# Patient Record
Sex: Female | Born: 1935 | Race: Black or African American | Hispanic: No
Health system: Southern US, Community
[De-identification: ages and names within clinical notes are randomized; demographics above are authoritative.]

---

## 2005-03-16 ENCOUNTER — Ambulatory Visit: Payer: Self-pay | Admitting: Internal Medicine

## 2005-10-07 ENCOUNTER — Ambulatory Visit: Payer: Self-pay | Admitting: Gastroenterology

## 2006-03-22 ENCOUNTER — Ambulatory Visit: Payer: Self-pay | Admitting: Internal Medicine

## 2006-03-23 ENCOUNTER — Inpatient Hospital Stay: Payer: Self-pay | Admitting: Orthopedic Surgery

## 2006-03-27 ENCOUNTER — Encounter: Payer: Self-pay | Admitting: Internal Medicine

## 2006-04-12 ENCOUNTER — Ambulatory Visit: Payer: Self-pay | Admitting: Internal Medicine

## 2006-08-26 ENCOUNTER — Ambulatory Visit: Payer: Self-pay | Admitting: Orthopedic Surgery

## 2006-09-09 ENCOUNTER — Inpatient Hospital Stay: Payer: Self-pay | Admitting: Orthopedic Surgery

## 2006-09-13 ENCOUNTER — Encounter: Payer: Self-pay | Admitting: Internal Medicine

## 2006-10-13 ENCOUNTER — Encounter: Payer: Self-pay | Admitting: Internal Medicine

## 2007-08-01 ENCOUNTER — Ambulatory Visit: Payer: Self-pay | Admitting: Internal Medicine

## 2008-01-04 ENCOUNTER — Ambulatory Visit: Payer: Self-pay | Admitting: Unknown Physician Specialty

## 2010-03-03 ENCOUNTER — Ambulatory Visit: Payer: Self-pay | Admitting: Internal Medicine

## 2012-01-13 ENCOUNTER — Ambulatory Visit: Payer: Self-pay

## 2012-02-10 ENCOUNTER — Ambulatory Visit: Payer: Self-pay | Admitting: Internal Medicine

## 2012-06-09 ENCOUNTER — Inpatient Hospital Stay: Payer: Self-pay | Admitting: Internal Medicine

## 2012-06-09 LAB — COMPREHENSIVE METABOLIC PANEL
Albumin: 3.2 g/dL — ABNORMAL LOW (ref 3.4–5.0)
Alkaline Phosphatase: 119 U/L (ref 50–136)
Anion Gap: 10 (ref 7–16)
BUN: 17 mg/dL (ref 7–18)
Bilirubin,Total: 0.3 mg/dL (ref 0.2–1.0)
Calcium, Total: 8.8 mg/dL (ref 8.5–10.1)
Chloride: 107 mmol/L (ref 98–107)
Creatinine: 1.03 mg/dL (ref 0.60–1.30)
EGFR (Non-African Amer.): 48 — ABNORMAL LOW
Glucose: 100 mg/dL — ABNORMAL HIGH (ref 65–99)
Potassium: 4 mmol/L (ref 3.5–5.1)
SGPT (ALT): 39 U/L (ref 12–78)
Sodium: 140 mmol/L (ref 136–145)
Total Protein: 7.3 g/dL (ref 6.4–8.2)

## 2012-06-09 LAB — URINALYSIS, COMPLETE
Blood: NEGATIVE
Glucose,UR: NEGATIVE mg/dL (ref 0–75)
Nitrite: NEGATIVE
Protein: NEGATIVE
RBC,UR: 2 /HPF (ref 0–5)
Specific Gravity: 1.014 (ref 1.003–1.030)
WBC UR: 6 /HPF (ref 0–5)

## 2012-06-09 LAB — CK TOTAL AND CKMB (NOT AT ARMC)
CK, Total: 68 U/L (ref 21–215)
CK-MB: 2.4 ng/mL (ref 0.5–3.6)

## 2012-06-09 LAB — CBC WITH DIFFERENTIAL/PLATELET
Basophil #: 0 10*3/uL (ref 0.0–0.1)
Eosinophil %: 0.4 %
HCT: 35 % (ref 35.0–47.0)
Lymphocyte #: 0.7 10*3/uL — ABNORMAL LOW (ref 1.0–3.6)
Lymphocyte %: 7.8 %
MCV: 101 fL — ABNORMAL HIGH (ref 80–100)
Monocyte %: 7.1 %
Neutrophil #: 7.6 10*3/uL — ABNORMAL HIGH (ref 1.4–6.5)
Neutrophil %: 84.5 %
RBC: 3.46 10*6/uL — ABNORMAL LOW (ref 3.80–5.20)
RDW: 15.2 % — ABNORMAL HIGH (ref 11.5–14.5)
WBC: 8.9 10*3/uL (ref 3.6–11.0)

## 2012-06-09 LAB — TSH: Thyroid Stimulating Horm: 2.76 u[IU]/mL

## 2012-06-09 LAB — TROPONIN I: Troponin-I: <0.02 ng/mL

## 2012-06-10 LAB — CK TOTAL AND CKMB (NOT AT ARMC)
CK, Total: 54 U/L (ref 21–215)
CK-MB: 2 ng/mL (ref 0.5–3.6)

## 2012-06-10 LAB — TROPONIN I: Troponin-I: 0.03 ng/mL

## 2012-06-13 LAB — BASIC METABOLIC PANEL
Chloride: 106 mmol/L (ref 98–107)
Co2: 24 mmol/L (ref 21–32)
Creatinine: 1.36 mg/dL — ABNORMAL HIGH (ref 0.60–1.30)
EGFR (African American): 40 — ABNORMAL LOW
EGFR (Non-African Amer.): 34 — ABNORMAL LOW
Sodium: 139 mmol/L (ref 136–145)

## 2012-06-13 LAB — CBC WITH DIFFERENTIAL/PLATELET
Basophil #: 0 10*3/uL (ref 0.0–0.1)
Lymphocyte #: 1.6 10*3/uL (ref 1.0–3.6)
Lymphocyte %: 18.1 %
MCHC: 34.6 g/dL (ref 32.0–36.0)
MCV: 101 fL — ABNORMAL HIGH (ref 80–100)
Monocyte %: 9.3 %
Platelet: 184 10*3/uL (ref 150–440)
RDW: 14.3 % (ref 11.5–14.5)
WBC: 8.9 10*3/uL (ref 3.6–11.0)

## 2012-06-14 LAB — CBC WITH DIFFERENTIAL/PLATELET
Basophil #: 0 10*3/uL (ref 0.0–0.1)
Eosinophil #: 0.3 10*3/uL (ref 0.0–0.7)
HCT: 30.6 % — ABNORMAL LOW (ref 35.0–47.0)
HGB: 10.8 g/dL — ABNORMAL LOW (ref 12.0–16.0)
Lymphocyte #: 1.1 10*3/uL (ref 1.0–3.6)
Lymphocyte %: 20.6 %
MCHC: 35.3 g/dL (ref 32.0–36.0)
MCV: 101 fL — ABNORMAL HIGH (ref 80–100)
Monocyte %: 11.8 %
Neutrophil #: 3.3 10*3/uL (ref 1.4–6.5)
Neutrophil %: 61.9 %
Platelet: 173 10*3/uL (ref 150–440)
RBC: 3.03 10*6/uL — ABNORMAL LOW (ref 3.80–5.20)
RDW: 14.8 % — ABNORMAL HIGH (ref 11.5–14.5)

## 2012-06-14 LAB — FOLATE: Folic Acid: 15.4 ng/mL (ref 3.1–100.0)

## 2012-06-14 LAB — BASIC METABOLIC PANEL WITH GFR
Anion Gap: 8 (ref 7–16)
BUN: 24 mg/dL — ABNORMAL HIGH (ref 7–18)
Calcium, Total: 8.3 mg/dL — ABNORMAL LOW (ref 8.5–10.1)
Chloride: 108 mmol/L — ABNORMAL HIGH (ref 98–107)
Co2: 24 mmol/L (ref 21–32)
Creatinine: 1.12 mg/dL (ref 0.60–1.30)
EGFR (African American): 50 — ABNORMAL LOW
EGFR (Non-African Amer.): 44 — ABNORMAL LOW
Glucose: 98 mg/dL (ref 65–99)
Osmolality: 283 (ref 275–301)
Potassium: 4.2 mmol/L (ref 3.5–5.1)
Sodium: 140 mmol/L (ref 136–145)

## 2012-07-10 ENCOUNTER — Inpatient Hospital Stay: Payer: Self-pay | Admitting: Internal Medicine

## 2012-07-10 LAB — URINALYSIS, COMPLETE
Glucose,UR: 150 mg/dL (ref 0–75)
Hyaline Cast: 1
Protein: NEGATIVE
Specific Gravity: 1.008 (ref 1.003–1.030)
WBC UR: 12 /HPF (ref 0–5)

## 2012-07-10 LAB — CBC
HCT: 34.9 % — ABNORMAL LOW (ref 35.0–47.0)
HGB: 11.5 g/dL — ABNORMAL LOW (ref 12.0–16.0)
MCH: 33.4 pg (ref 26.0–34.0)
MCHC: 33 g/dL (ref 32.0–36.0)
RBC: 3.44 10*6/uL — ABNORMAL LOW (ref 3.80–5.20)
RDW: 15.4 % — ABNORMAL HIGH (ref 11.5–14.5)

## 2012-07-10 LAB — COMPREHENSIVE METABOLIC PANEL
Alkaline Phosphatase: 170 U/L — ABNORMAL HIGH (ref 50–136)
BUN: 16 mg/dL (ref 7–18)
Bilirubin,Total: 0.4 mg/dL (ref 0.2–1.0)
Chloride: 104 mmol/L (ref 98–107)
Co2: 27 mmol/L (ref 21–32)
EGFR (Non-African Amer.): 53 — ABNORMAL LOW
Glucose: 170 mg/dL — ABNORMAL HIGH (ref 65–99)
Potassium: 3.3 mmol/L — ABNORMAL LOW (ref 3.5–5.1)
SGOT(AST): 32 U/L (ref 15–37)
Total Protein: 7.7 g/dL (ref 6.4–8.2)

## 2012-07-10 LAB — TROPONIN I: Troponin-I: <0.02 ng/mL

## 2012-07-11 LAB — BASIC METABOLIC PANEL
Anion Gap: 9 (ref 7–16)
BUN: 11 mg/dL (ref 7–18)
Chloride: 111 mmol/L — ABNORMAL HIGH (ref 98–107)
Co2: 24 mmol/L (ref 21–32)
Creatinine: 0.93 mg/dL (ref 0.60–1.30)
EGFR (African American): >60
Glucose: 90 mg/dL (ref 65–99)
Osmolality: 286 (ref 275–301)
Potassium: 3.9 mmol/L (ref 3.5–5.1)
Sodium: 144 mmol/L (ref 136–145)

## 2012-07-11 LAB — TROPONIN I: Troponin-I: 0.03 ng/mL

## 2012-07-11 LAB — HEMOGLOBIN A1C: Hemoglobin A1C: 5.4 % (ref 4.2–6.3)

## 2012-07-11 LAB — CBC WITH DIFFERENTIAL/PLATELET
Basophil %: 0.4 %
Lymphocyte #: 1.2 10*3/uL (ref 1.0–3.6)
Lymphocyte %: 17.1 %
MCHC: 33.8 g/dL (ref 32.0–36.0)
MCV: 101 fL — ABNORMAL HIGH (ref 80–100)
Monocyte #: 0.7 x10 3/mm (ref 0.2–0.9)
RBC: 3 10*6/uL — ABNORMAL LOW (ref 3.80–5.20)

## 2012-07-11 LAB — MAGNESIUM: Magnesium: 1.8 mg/dL

## 2012-07-12 LAB — BASIC METABOLIC PANEL
Anion Gap: 7 (ref 7–16)
Chloride: 111 mmol/L — ABNORMAL HIGH (ref 98–107)
EGFR (Non-African Amer.): 55 — ABNORMAL LOW
Glucose: 95 mg/dL (ref 65–99)
Osmolality: 286 (ref 275–301)
Sodium: 144 mmol/L (ref 136–145)

## 2012-07-12 LAB — CBC WITH DIFFERENTIAL/PLATELET
Basophil #: 0.1 10*3/uL (ref 0.0–0.1)
Basophil %: 1.3 %
Eosinophil #: 0.3 10*3/uL (ref 0.0–0.7)
Eosinophil %: 3.8 %
HCT: 30.3 % — ABNORMAL LOW (ref 35.0–47.0)
MCV: 101 fL — ABNORMAL HIGH (ref 80–100)
Monocyte #: 0.7 x10 3/mm (ref 0.2–0.9)
Neutrophil #: 4.5 10*3/uL (ref 1.4–6.5)
Neutrophil %: 66.9 %
RBC: 3 10*6/uL — ABNORMAL LOW (ref 3.80–5.20)
WBC: 6.7 10*3/uL (ref 3.6–11.0)

## 2012-07-14 ENCOUNTER — Encounter: Payer: Self-pay | Admitting: Internal Medicine

## 2012-07-15 ENCOUNTER — Encounter: Payer: Self-pay | Admitting: Internal Medicine

## 2012-07-18 LAB — TSH: Thyroid Stimulating Horm: 3.64 u[IU]/mL

## 2012-07-28 LAB — BASIC METABOLIC PANEL
Anion Gap: 7 (ref 7–16)
BUN: 22 mg/dL — ABNORMAL HIGH (ref 7–18)
Chloride: 109 mmol/L — ABNORMAL HIGH (ref 98–107)
Creatinine: 0.91 mg/dL (ref 0.60–1.30)
EGFR (African American): >60
EGFR (Non-African Amer.): 56 — ABNORMAL LOW
Potassium: 3.6 mmol/L (ref 3.5–5.1)
Sodium: 143 mmol/L (ref 136–145)

## 2012-07-28 LAB — CBC WITH DIFFERENTIAL/PLATELET
Basophil #: 0 10*3/uL (ref 0.0–0.1)
Eosinophil %: 1.3 %
HCT: 35.4 % (ref 35.0–47.0)
Lymphocyte #: 1.4 10*3/uL (ref 1.0–3.6)
Lymphocyte %: 18.4 %
MCH: 33.2 pg (ref 26.0–34.0)
MCHC: 32.7 g/dL (ref 32.0–36.0)
MCV: 101 fL — ABNORMAL HIGH (ref 80–100)
Monocyte #: 0.5 x10 3/mm (ref 0.2–0.9)
Monocyte %: 6.2 %
Neutrophil #: 5.4 10*3/uL (ref 1.4–6.5)
Platelet: 215 10*3/uL (ref 150–440)
RBC: 3.49 10*6/uL — ABNORMAL LOW (ref 3.80–5.20)
RDW: 15.6 % — ABNORMAL HIGH (ref 11.5–14.5)

## 2012-07-28 LAB — LIPID PANEL
Cholesterol: 179 mg/dL (ref 0–200)
Ldl Cholesterol, Calc: 88 mg/dL (ref 0–100)
Triglycerides: 127 mg/dL (ref 0–200)

## 2012-07-28 LAB — HEPATIC FUNCTION PANEL A (ARMC)
Bilirubin, Direct: <0.1 mg/dL (ref 0.00–0.20)
SGOT(AST): 28 U/L (ref 15–37)
SGPT (ALT): 20 U/L (ref 12–78)

## 2012-07-28 LAB — TSH: Thyroid Stimulating Horm: 3.81 u[IU]/mL

## 2012-12-25 ENCOUNTER — Encounter: Payer: Self-pay | Admitting: Internal Medicine

## 2013-01-12 ENCOUNTER — Encounter: Payer: Self-pay | Admitting: Internal Medicine

## 2013-01-30 LAB — CBC WITH DIFFERENTIAL/PLATELET
HCT: 34.7 % — ABNORMAL LOW (ref 35.0–47.0)
HGB: 11.9 g/dL — ABNORMAL LOW (ref 12.0–16.0)
Lymphocyte %: 17.5 %
MCHC: 34.3 g/dL (ref 32.0–36.0)
MCV: 100 fL (ref 80–100)
Monocyte #: 0.5 x10 3/mm (ref 0.2–0.9)
Monocyte %: 5.3 %
Neutrophil %: 75.4 %
Platelet: 166 10*3/uL (ref 150–440)
RBC: 3.46 10*6/uL — ABNORMAL LOW (ref 3.80–5.20)
WBC: 9.1 10*3/uL (ref 3.6–11.0)

## 2013-01-30 LAB — BASIC METABOLIC PANEL
Calcium, Total: 8.8 mg/dL (ref 8.5–10.1)
Chloride: 106 mmol/L (ref 98–107)
Co2: 31 mmol/L (ref 21–32)
Glucose: 80 mg/dL (ref 65–99)
Potassium: 3.7 mmol/L (ref 3.5–5.1)

## 2013-01-30 LAB — CREATININE, SERUM
Creatinine: 0.93 mg/dL (ref 0.60–1.30)
EGFR (African American): >60

## 2013-01-30 LAB — LIPID PANEL
Cholesterol: 187 mg/dL (ref 0–200)
HDL Cholesterol: 81 mg/dL — ABNORMAL HIGH (ref 40–60)
Ldl Cholesterol, Calc: 87 mg/dL (ref 0–100)
Triglycerides: 95 mg/dL (ref 0–200)
VLDL Cholesterol, Calc: 19 mg/dL (ref 5–40)

## 2013-01-30 LAB — ALT: SGPT (ALT): 33 U/L (ref 12–78)

## 2013-01-30 LAB — ALBUMIN: Albumin: 3.2 g/dL — ABNORMAL LOW (ref 3.4–5.0)

## 2013-01-30 LAB — SGOT (AST)(ARMC): SGOT(AST): 28 U/L (ref 15–37)

## 2013-02-12 ENCOUNTER — Encounter: Payer: Self-pay | Admitting: Internal Medicine

## 2013-03-14 ENCOUNTER — Encounter: Payer: Self-pay | Admitting: Internal Medicine

## 2013-04-14 ENCOUNTER — Encounter: Payer: Self-pay | Admitting: Internal Medicine

## 2013-04-17 LAB — CBC WITH DIFFERENTIAL/PLATELET
Basophil #: 0 10*3/uL (ref 0.0–0.1)
Eosinophil #: 0.2 10*3/uL (ref 0.0–0.7)
Eosinophil %: 2.3 %
HCT: 30.3 % — ABNORMAL LOW (ref 35.0–47.0)
HGB: 10.6 g/dL — ABNORMAL LOW (ref 12.0–16.0)
MCHC: 34.8 g/dL (ref 32.0–36.0)
Monocyte %: 7.8 %
Neutrophil #: 4.3 10*3/uL (ref 1.4–6.5)
Neutrophil %: 59.4 %
RBC: 3 10*6/uL — ABNORMAL LOW (ref 3.80–5.20)
WBC: 7.3 10*3/uL (ref 3.6–11.0)

## 2013-04-17 LAB — CREATININE, SERUM
EGFR (African American): >60
EGFR (Non-African Amer.): 56 — ABNORMAL LOW

## 2013-04-17 LAB — ALBUMIN: Albumin: 2.8 g/dL — ABNORMAL LOW (ref 3.4–5.0)

## 2013-04-17 LAB — ALT: SGPT (ALT): 41 U/L (ref 12–78)

## 2013-04-17 LAB — SEDIMENTATION RATE: Erythrocyte Sed Rate: 58 mm/hr — ABNORMAL HIGH (ref 0–30)

## 2013-05-14 ENCOUNTER — Encounter: Payer: Self-pay | Admitting: Internal Medicine

## 2013-06-14 ENCOUNTER — Encounter: Payer: Self-pay | Admitting: Internal Medicine

## 2013-06-21 LAB — ALT: ALT: 27 U/L (ref 12–78)

## 2013-06-21 LAB — CBC WITH DIFFERENTIAL/PLATELET
BASOS PCT: 0.2 %
Basophil #: 0 10*3/uL (ref 0.0–0.1)
Eosinophil #: 0.1 10*3/uL (ref 0.0–0.7)
Eosinophil %: 2.1 %
HCT: 34.5 % — AB (ref 35.0–47.0)
HGB: 11.6 g/dL — ABNORMAL LOW (ref 12.0–16.0)
Lymphocyte #: 2.4 10*3/uL (ref 1.0–3.6)
Lymphocyte %: 33.8 %
MCH: 34.8 pg — ABNORMAL HIGH (ref 26.0–34.0)
MCHC: 33.8 g/dL (ref 32.0–36.0)
MCV: 103 fL — ABNORMAL HIGH (ref 80–100)
Monocyte #: 0.6 x10 3/mm (ref 0.2–0.9)
Monocyte %: 8.9 %
NEUTROS ABS: 4 10*3/uL (ref 1.4–6.5)
Neutrophil %: 55 %
Platelet: 147 10*3/uL — ABNORMAL LOW (ref 150–440)
RBC: 3.34 10*6/uL — AB (ref 3.80–5.20)
RDW: 14.6 % — AB (ref 11.5–14.5)
WBC: 7.2 10*3/uL (ref 3.6–11.0)

## 2013-06-21 LAB — CREATININE, SERUM
Creatinine: 0.96 mg/dL (ref 0.60–1.30)
EGFR (African American): >60
GFR CALC NON AF AMER: 52 — AB

## 2013-06-21 LAB — ALBUMIN: ALBUMIN: 3.1 g/dL — AB (ref 3.4–5.0)

## 2013-06-21 LAB — SEDIMENTATION RATE: Erythrocyte Sed Rate: 41 mm/hr — ABNORMAL HIGH (ref 0–30)

## 2013-06-21 LAB — SGOT (AST)(ARMC): AST: 24 U/L (ref 15–37)

## 2013-06-26 LAB — TSH: Thyroid Stimulating Horm: 1.37 u[IU]/mL

## 2013-06-26 LAB — MAGNESIUM: Magnesium: 1.9 mg/dL

## 2013-07-15 ENCOUNTER — Encounter: Payer: Self-pay | Admitting: Internal Medicine

## 2013-08-12 ENCOUNTER — Encounter: Payer: Self-pay | Admitting: Internal Medicine

## 2013-08-16 LAB — SGOT (AST)(ARMC): SGOT(AST): 26 U/L (ref 15–37)

## 2013-08-16 LAB — CBC WITH DIFFERENTIAL/PLATELET
BASOS PCT: 0.2 %
Basophil #: 0 10*3/uL (ref 0.0–0.1)
EOS ABS: 0.2 10*3/uL (ref 0.0–0.7)
EOS PCT: 2.2 %
HCT: 34 % — AB (ref 35.0–47.0)
HGB: 11.5 g/dL — ABNORMAL LOW (ref 12.0–16.0)
LYMPHS ABS: 2.4 10*3/uL (ref 1.0–3.6)
Lymphocyte %: 33.4 %
MCH: 35.7 pg — AB (ref 26.0–34.0)
MCHC: 33.8 g/dL (ref 32.0–36.0)
MCV: 106 fL — ABNORMAL HIGH (ref 80–100)
MONOS PCT: 8.9 %
Monocyte #: 0.6 x10 3/mm (ref 0.2–0.9)
NEUTROS ABS: 4 10*3/uL (ref 1.4–6.5)
Neutrophil %: 55.3 %
PLATELETS: 126 10*3/uL — AB (ref 150–440)
RBC: 3.22 10*6/uL — AB (ref 3.80–5.20)
RDW: 15 % — AB (ref 11.5–14.5)
WBC: 7.2 10*3/uL (ref 3.6–11.0)

## 2013-08-16 LAB — CREATININE, SERUM
CREATININE: 0.98 mg/dL (ref 0.60–1.30)
EGFR (African American): 59 — ABNORMAL LOW
GFR CALC NON AF AMER: 51 — AB

## 2013-08-16 LAB — ALBUMIN: Albumin: 3.1 g/dL — ABNORMAL LOW (ref 3.4–5.0)

## 2013-08-16 LAB — ALT: ALT: 26 U/L (ref 12–78)

## 2013-08-16 LAB — SEDIMENTATION RATE: ERYTHROCYTE SED RATE: 34 mm/h — AB (ref 0–30)

## 2013-09-12 ENCOUNTER — Encounter: Payer: Self-pay | Admitting: Internal Medicine

## 2013-10-12 ENCOUNTER — Encounter: Payer: Self-pay | Admitting: Internal Medicine

## 2013-10-16 LAB — CBC WITH DIFFERENTIAL/PLATELET
Basophil #: 0 10*3/uL (ref 0.0–0.1)
Basophil %: 0.3 %
Eosinophil #: 0.2 10*3/uL (ref 0.0–0.7)
Eosinophil %: 2.4 %
HCT: 31.7 % — ABNORMAL LOW (ref 35.0–47.0)
HGB: 10.8 g/dL — AB (ref 12.0–16.0)
Lymphocyte #: 2.1 10*3/uL (ref 1.0–3.6)
Lymphocyte %: 29.8 %
MCH: 35.6 pg — AB (ref 26.0–34.0)
MCHC: 33.9 g/dL (ref 32.0–36.0)
MCV: 105 fL — ABNORMAL HIGH (ref 80–100)
Monocyte #: 0.5 x10 3/mm (ref 0.2–0.9)
Monocyte %: 7.6 %
Neutrophil #: 4.2 10*3/uL (ref 1.4–6.5)
Neutrophil %: 59.9 %
Platelet: 123 10*3/uL — ABNORMAL LOW (ref 150–440)
RBC: 3.02 10*6/uL — ABNORMAL LOW (ref 3.80–5.20)
RDW: 14.7 % — ABNORMAL HIGH (ref 11.5–14.5)
WBC: 7.1 10*3/uL (ref 3.6–11.0)

## 2013-10-16 LAB — CREATININE, SERUM
CREATININE: 0.8 mg/dL (ref 0.60–1.30)
EGFR (African American): >60
EGFR (Non-African Amer.): >60

## 2013-10-16 LAB — ALT: SGPT (ALT): 19 U/L (ref 12–78)

## 2013-10-16 LAB — ALBUMIN: ALBUMIN: 2.8 g/dL — AB (ref 3.4–5.0)

## 2013-10-16 LAB — SGOT (AST)(ARMC): SGOT(AST): 19 U/L (ref 15–37)

## 2013-10-16 LAB — SEDIMENTATION RATE: Erythrocyte Sed Rate: 34 mm/hr — ABNORMAL HIGH (ref 0–30)

## 2013-11-12 ENCOUNTER — Encounter: Payer: Self-pay | Admitting: Internal Medicine

## 2013-12-12 ENCOUNTER — Encounter: Payer: Self-pay | Admitting: Internal Medicine

## 2013-12-18 LAB — CBC WITH DIFFERENTIAL/PLATELET
BASOS ABS: 0 10*3/uL (ref 0.0–0.1)
Basophil %: 0.2 %
EOS PCT: 2.4 %
Eosinophil #: 0.2 10*3/uL (ref 0.0–0.7)
HCT: 36.7 % (ref 35.0–47.0)
HGB: 12 g/dL (ref 12.0–16.0)
LYMPHS PCT: 29.1 %
Lymphocyte #: 2.2 10*3/uL (ref 1.0–3.6)
MCH: 34.5 pg — AB (ref 26.0–34.0)
MCHC: 32.6 g/dL (ref 32.0–36.0)
MCV: 106 fL — AB (ref 80–100)
MONO ABS: 0.5 x10 3/mm (ref 0.2–0.9)
MONOS PCT: 6.6 %
Neutrophil #: 4.7 10*3/uL (ref 1.4–6.5)
Neutrophil %: 61.7 %
Platelet: 145 10*3/uL — ABNORMAL LOW (ref 150–440)
RBC: 3.47 10*6/uL — ABNORMAL LOW (ref 3.80–5.20)
RDW: 14.8 % — ABNORMAL HIGH (ref 11.5–14.5)
WBC: 7.6 10*3/uL (ref 3.6–11.0)

## 2013-12-18 LAB — SGOT (AST)(ARMC): AST: 24 U/L (ref 15–37)

## 2013-12-18 LAB — CREATININE, SERUM
Creatinine: 0.95 mg/dL (ref 0.60–1.30)
EGFR (African American): >60
EGFR (Non-African Amer.): 53 — ABNORMAL LOW

## 2013-12-18 LAB — SEDIMENTATION RATE: ERYTHROCYTE SED RATE: 1 mm/h (ref 0–30)

## 2013-12-18 LAB — ALBUMIN: Albumin: 3 g/dL — ABNORMAL LOW (ref 3.4–5.0)

## 2013-12-18 LAB — ALT: SGPT (ALT): 27 U/L (ref 12–78)

## 2014-01-12 ENCOUNTER — Encounter: Payer: Self-pay | Admitting: Internal Medicine

## 2014-02-12 ENCOUNTER — Encounter: Payer: Self-pay | Admitting: Internal Medicine

## 2014-02-12 LAB — CBC WITH DIFFERENTIAL/PLATELET
Basophil #: 0 10*3/uL (ref 0.0–0.1)
Basophil %: 0.2 %
EOS PCT: 2.2 %
Eosinophil #: 0.2 10*3/uL (ref 0.0–0.7)
HCT: 34.8 % — ABNORMAL LOW (ref 35.0–47.0)
HGB: 11.3 g/dL — AB (ref 12.0–16.0)
LYMPHS ABS: 1.7 10*3/uL (ref 1.0–3.6)
LYMPHS PCT: 24 %
MCH: 34.6 pg — ABNORMAL HIGH (ref 26.0–34.0)
MCHC: 32.5 g/dL (ref 32.0–36.0)
MCV: 106 fL — ABNORMAL HIGH (ref 80–100)
Monocyte #: 0.5 x10 3/mm (ref 0.2–0.9)
Monocyte %: 6.5 %
Neutrophil #: 4.7 10*3/uL (ref 1.4–6.5)
Neutrophil %: 67.1 %
Platelet: 135 10*3/uL — ABNORMAL LOW (ref 150–440)
RBC: 3.28 10*6/uL — ABNORMAL LOW (ref 3.80–5.20)
RDW: 14.8 % — AB (ref 11.5–14.5)
WBC: 7.1 10*3/uL (ref 3.6–11.0)

## 2014-02-12 LAB — CREATININE, SERUM
Creatinine: 1 mg/dL (ref 0.60–1.30)
GFR CALC AF AMER: 57 — AB
GFR CALC NON AF AMER: 50 — AB

## 2014-02-12 LAB — ALBUMIN: Albumin: 3.1 g/dL — ABNORMAL LOW (ref 3.4–5.0)

## 2014-02-12 LAB — SGOT (AST)(ARMC): SGOT(AST): 28 U/L (ref 15–37)

## 2014-02-12 LAB — SEDIMENTATION RATE: Erythrocyte Sed Rate: 19 mm/hr (ref 0–30)

## 2014-02-12 LAB — ALT: SGPT (ALT): 25 U/L

## 2014-03-14 ENCOUNTER — Encounter: Payer: Self-pay | Admitting: Internal Medicine

## 2014-04-14 ENCOUNTER — Encounter: Payer: Self-pay | Admitting: Internal Medicine

## 2014-04-16 LAB — SEDIMENTATION RATE: Erythrocyte Sed Rate: 47 mm/hr — ABNORMAL HIGH (ref 0–30)

## 2014-04-16 LAB — CBC WITH DIFFERENTIAL/PLATELET
BASOS PCT: 0.3 %
Basophil #: 0 10*3/uL (ref 0.0–0.1)
EOS PCT: 2.2 %
Eosinophil #: 0.2 10*3/uL (ref 0.0–0.7)
HCT: 34.6 % — ABNORMAL LOW (ref 35.0–47.0)
HGB: 11.4 g/dL — ABNORMAL LOW (ref 12.0–16.0)
Lymphocyte #: 1.9 10*3/uL (ref 1.0–3.6)
Lymphocyte %: 25 %
MCH: 34.5 pg — ABNORMAL HIGH (ref 26.0–34.0)
MCHC: 32.9 g/dL (ref 32.0–36.0)
MCV: 105 fL — ABNORMAL HIGH (ref 80–100)
MONOS PCT: 6.6 %
Monocyte #: 0.5 x10 3/mm (ref 0.2–0.9)
NEUTROS ABS: 5 10*3/uL (ref 1.4–6.5)
NEUTROS PCT: 65.9 %
PLATELETS: 143 10*3/uL — AB (ref 150–440)
RBC: 3.3 10*6/uL — ABNORMAL LOW (ref 3.80–5.20)
RDW: 14 % (ref 11.5–14.5)
WBC: 7.7 10*3/uL (ref 3.6–11.0)

## 2014-04-16 LAB — CREATININE, SERUM
Creatinine: 0.88 mg/dL (ref 0.60–1.30)
EGFR (African American): >60

## 2014-04-16 LAB — ALBUMIN: Albumin: 3 g/dL — ABNORMAL LOW (ref 3.4–5.0)

## 2014-04-16 LAB — ALT: SGPT (ALT): 28 U/L

## 2014-04-16 LAB — SGOT (AST)(ARMC): AST: 26 U/L (ref 15–37)

## 2014-05-14 ENCOUNTER — Encounter: Payer: Self-pay | Admitting: Internal Medicine

## 2014-06-14 ENCOUNTER — Encounter: Payer: Self-pay | Admitting: Internal Medicine

## 2014-06-17 LAB — RAPID INFLUENZA A&B ANTIGENS

## 2014-06-18 LAB — CBC WITH DIFFERENTIAL/PLATELET
Basophil #: 0 10*3/uL (ref 0.0–0.1)
Basophil %: 0.2 %
Eosinophil #: 0.1 10*3/uL (ref 0.0–0.7)
Eosinophil %: 0.9 %
HCT: 37.9 % (ref 35.0–47.0)
HGB: 12.6 g/dL (ref 12.0–16.0)
LYMPHS ABS: 1.4 10*3/uL (ref 1.0–3.6)
Lymphocyte %: 13.5 %
MCH: 34.6 pg — AB (ref 26.0–34.0)
MCHC: 33.4 g/dL (ref 32.0–36.0)
MCV: 104 fL — ABNORMAL HIGH (ref 80–100)
MONO ABS: 0.9 x10 3/mm (ref 0.2–0.9)
MONOS PCT: 9.1 %
Neutrophil #: 7.9 10*3/uL — ABNORMAL HIGH (ref 1.4–6.5)
Neutrophil %: 76.3 %
PLATELETS: 143 10*3/uL — AB (ref 150–440)
RBC: 3.65 10*6/uL — ABNORMAL LOW (ref 3.80–5.20)
RDW: 14.3 % (ref 11.5–14.5)
WBC: 10.4 10*3/uL (ref 3.6–11.0)

## 2014-06-18 LAB — CREATININE, SERUM
Creatinine: 1.1 mg/dL (ref 0.60–1.30)
EGFR (Non-African Amer.): 49 — ABNORMAL LOW
GFR CALC AF AMER: 60 — AB

## 2014-06-18 LAB — SEDIMENTATION RATE: ERYTHROCYTE SED RATE: 60 mm/h — AB (ref 0–30)

## 2014-06-18 LAB — SGOT (AST)(ARMC): SGOT(AST): 28 U/L (ref 15–37)

## 2014-06-18 LAB — ALT: SGPT (ALT): 31 U/L

## 2014-06-18 LAB — ALBUMIN: Albumin: 3.2 g/dL — ABNORMAL LOW (ref 3.4–5.0)

## 2014-06-23 LAB — URINALYSIS, COMPLETE
Bilirubin,UR: NEGATIVE
Blood: NEGATIVE
Glucose,UR: NEGATIVE mg/dL (ref 0–75)
Ketone: NEGATIVE
LEUKOCYTE ESTERASE: NEGATIVE
NITRITE: NEGATIVE
PH: 7 (ref 4.5–8.0)
PROTEIN: NEGATIVE
Specific Gravity: 1.017 (ref 1.003–1.030)
Squamous Epithelial: 1
WBC UR: NONE SEEN /HPF (ref 0–5)

## 2014-06-24 LAB — URINE CULTURE

## 2014-07-13 IMAGING — CT CT ANGIO ABD-PELV WO/W CM
2 of 4 series · 13 of 42 positions shown, 18 images · IV contrast (APPLIED)
Comparison: none

REASON FOR EXAM: abdomen pain
COMMENTS:

PROCEDURE:     CT  - CT ANGIOGRAPHY ABD/PEL W/WO  - June 14, 2012 [DATE]
RESULT:      Comparison:  None
TECHNIQUE: Multiple axial images of the abdomen and pelvis were performed
from the lung bases to the pubic symphysis, without p.o. contrast and with
100 mL of Isovue 370 intravenous contrast, according to the mesenteric
ischemia protocol. Coronal and sagittal MIP images were reconstructed.

[Series 6: venous · axial · portal-venous · 0.63mm/px · z∈[-705,-360]mm · 10 of 137 slices shown, 15 images]
[im 11/137  soft-tissue]
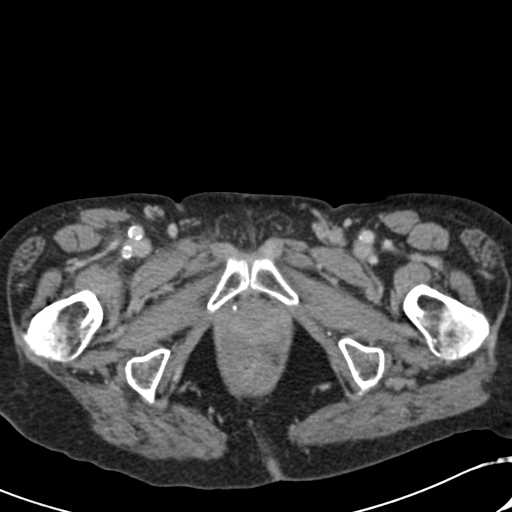
[im 11/137  bone]
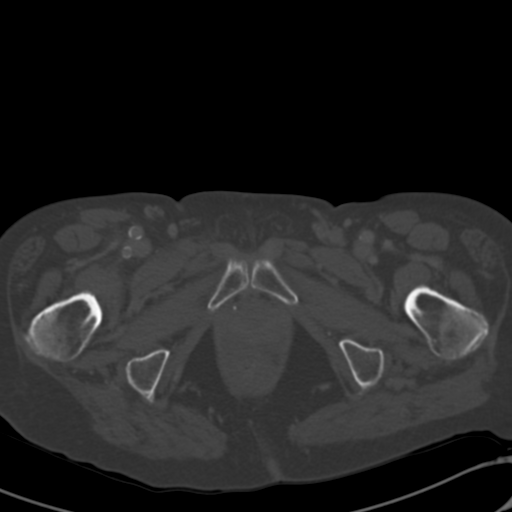
[im 32/137  soft-tissue]
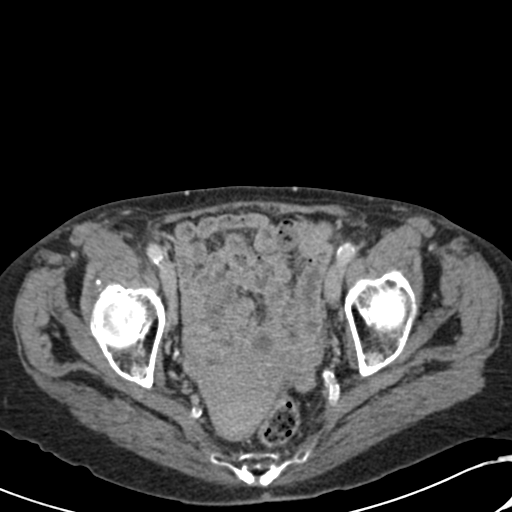
[im 42/137  soft-tissue]
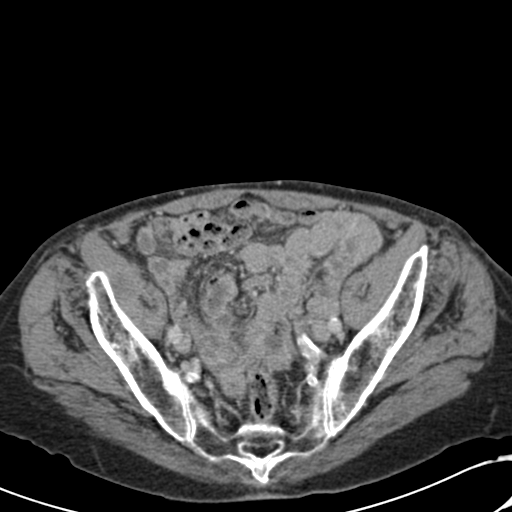
[im 53/137  soft-tissue]
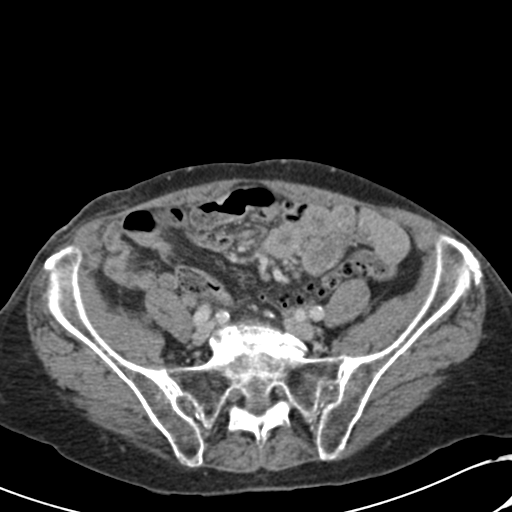
[im 74/137  soft-tissue]
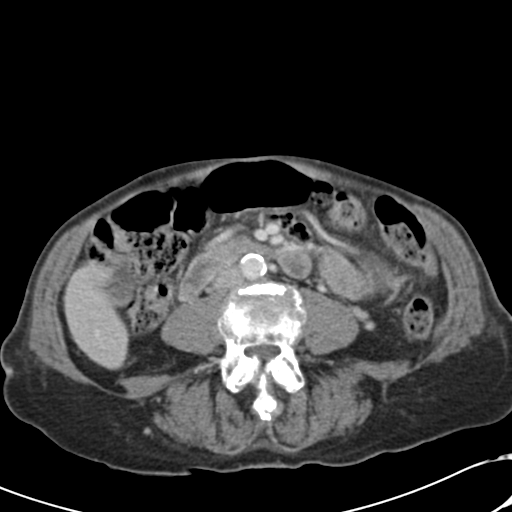
[im 84/137  soft-tissue]
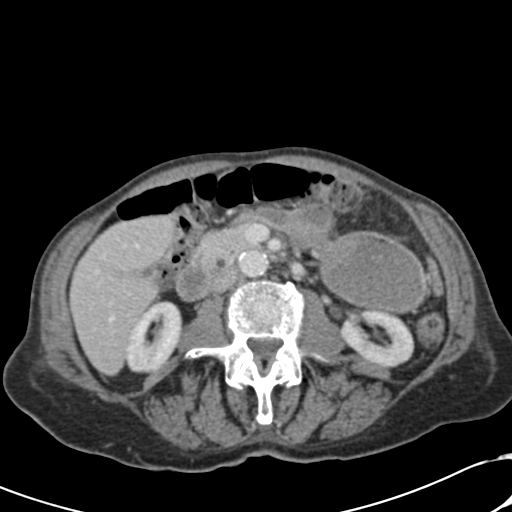
[im 95/137  soft-tissue]
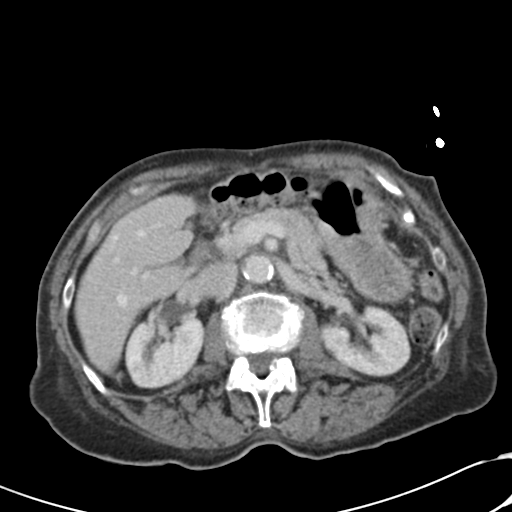
[im 95/137  lung]
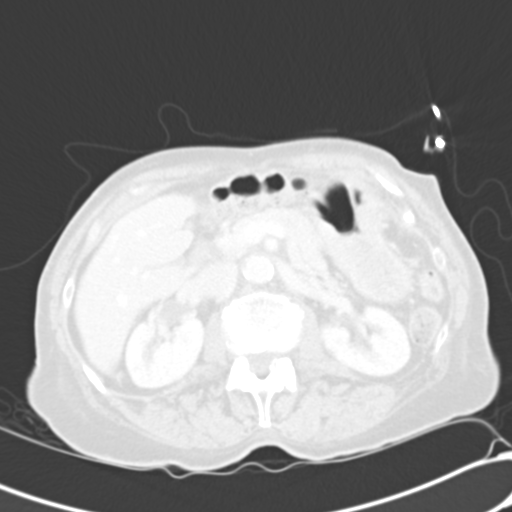
[im 105/137  lung]
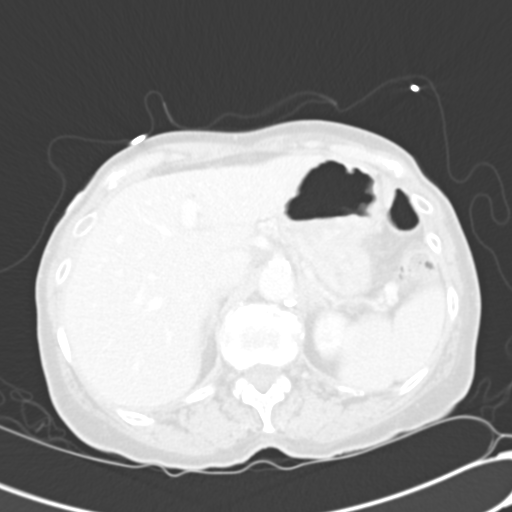
[im 116/137  soft-tissue]
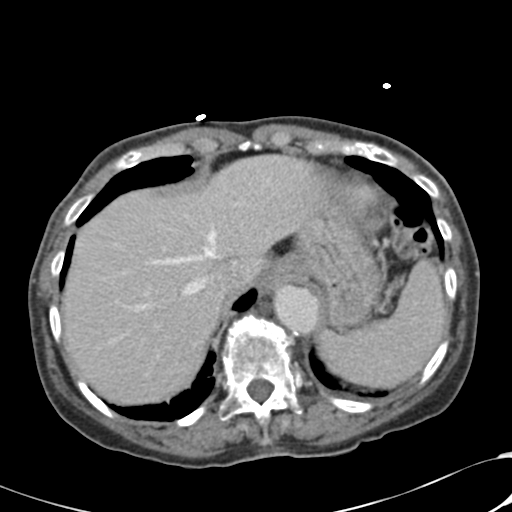
[im 116/137  lung]
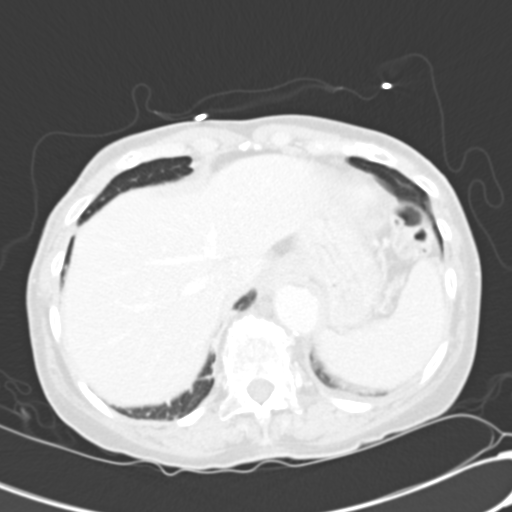
[im 126/137  soft-tissue]
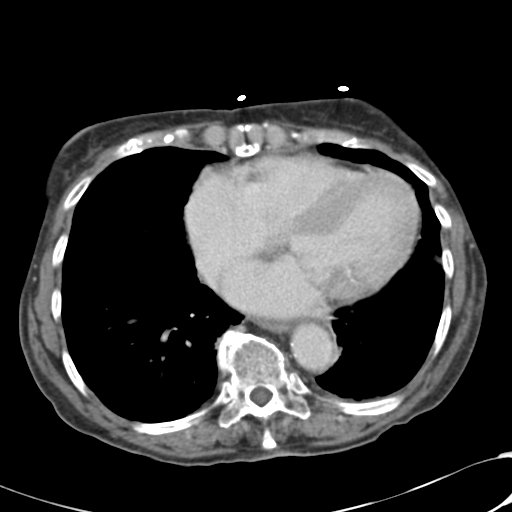
[im 126/137  lung]
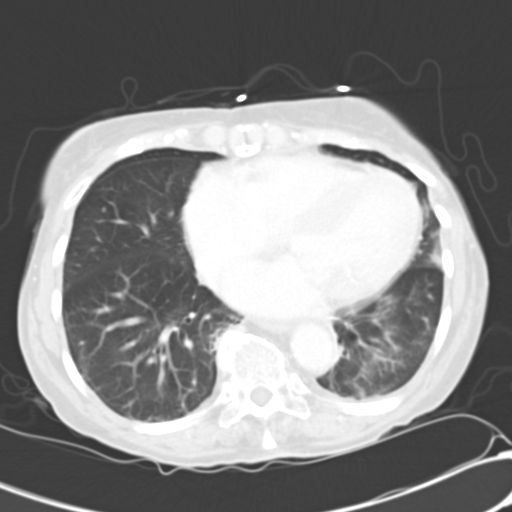
[im 126/137  bone]
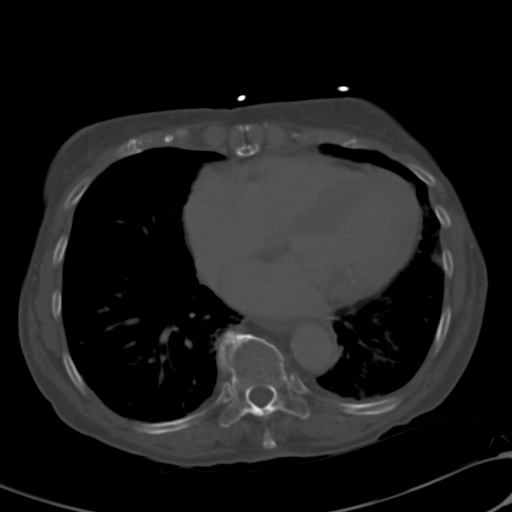

[Series 602: coronal · coronal · 0.80mm/px · 3 of 183 slices shown]
[im 61/183  soft-tissue]
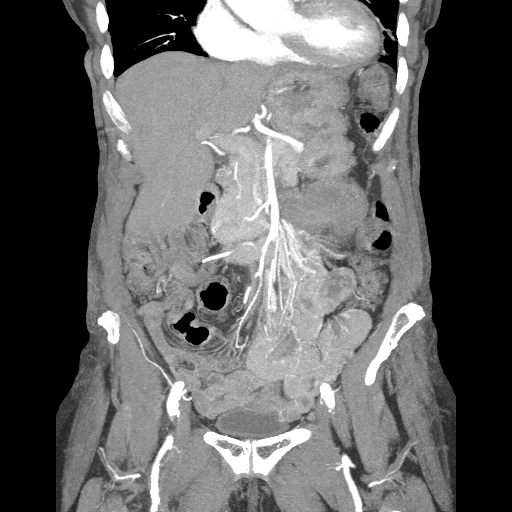
[im 81/183  soft-tissue]
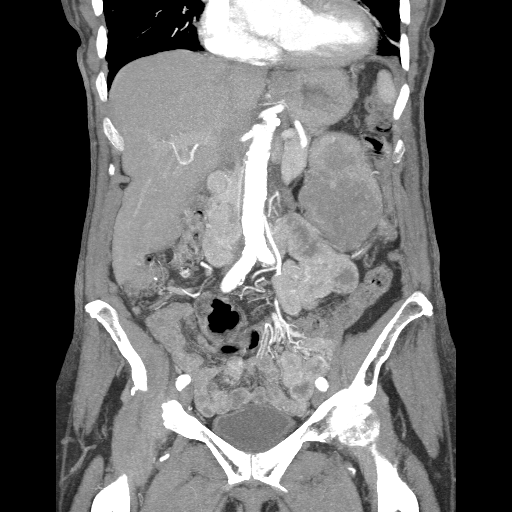
[im 102/183  soft-tissue]
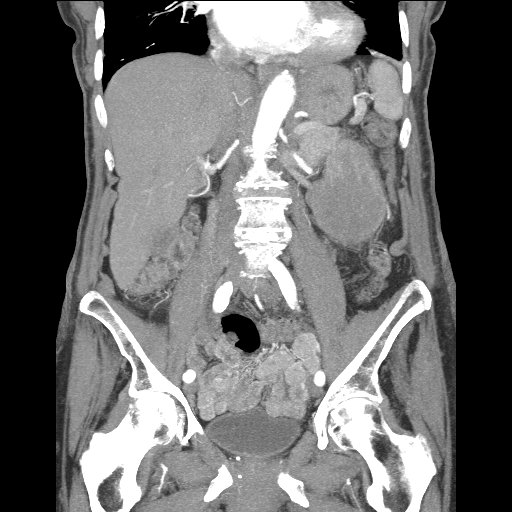

[13 of 42 positions shown; findings below may reference images not displayed]

FINDINGS: There is mild paraseptal emphysema. Mild basilar opacities are likely
secondary to atelectasis. There is a 4 mm subpleural nodule in the right
middle lobe, image 15. Calcifications are seen in the coronary arteries.

The abdominal aorta is normal in caliber. The celiac artery, SMA, and IMA
are patent. The bilateral renal arteries are patent. Mild atherosclerotic
calcifications are seen in abdominal aorta. The main portal vein is patent.
The liver, gallbladder, spleen, adrenals, and pancreas are unremarkable.
There is a small duodenal diverticulum extending medially into the head of
the pancreas. There is a 1.7 cm low-attenuation lesion in left kidney which
is indeterminate by density criteria. Other small low-attenuation foci in
the bilateral kidneys are too small to characterize. There is a small
approximately 10 mm cylindrical focus which extends into the lumen of the
gastric body as seen on image 50-52. This is nonspecific, and possibility
would include a small polyp.

Small round area of hypoenhancement in the posterior aspect of the uterus is
nonspecific, but likely represents a fibroid. The small and large bowel are
normal in caliber. There is a small tubular structure at the base of the
cecum which is felt to represent a normal appendix.

No aggressive lytic or sclerotic osseous lesions are identified.
IMPRESSION: 1. The celiac artery, SMA, and IMA are patent. No abdominal aortic aneurysm.
2. Small low-attenuation lesion in the left kidney may represent a
hemorrhagic/ proteinaceous cyst, but is indeterminate by density criteria.
Multiphasic contrast-enhanced renal CT is suggested for further evaluation.
3. Indeterminate 4 mm nodule the right middle lobe. If the patient is at low
risk for lung cancer, no further follow-up is recommended.  If the patient
is at high risk for lung cancer, recommend 12 month follow-up noncontrast
chest CT.
4. Small polypoid soft tissue density extends into the body of the gastric
lumen as described above. This is nonspecific and the differential would
include a small polyp. Further evaluation could provided with endoscopy, as
indicated.

[REDACTED]

## 2014-07-30 ENCOUNTER — Encounter: Payer: Self-pay | Admitting: Internal Medicine

## 2014-08-13 ENCOUNTER — Encounter
Admit: 2014-08-13 | Disposition: A | Discharge status: Home / Self Care | Payer: Self-pay | Attending: Internal Medicine | Admitting: Internal Medicine

## 2014-09-13 ENCOUNTER — Encounter
Admit: 2014-09-13 | Disposition: A | Discharge status: Home / Self Care | Payer: Self-pay | Attending: Internal Medicine | Admitting: Internal Medicine

## 2014-10-01 NOTE — H&P (Signed)
PATIENT NAME:  Debra Singh, Debra Singh MR#:  [id number] DATE OF BIRTH:  [birth year]  DATE OF ADMISSION:  06/09/2012  PRIMARY CARE PHYSICIAN:  Barbette ReichmannVishwanath Hande, Debra Singh.  CHIEF COMPLAINT:  Syncope.  HISTORY OF PRESENT ILLNESS: This is an 81 year old female who presents to the Emergency Room due to recurrent syncopal episodes in the past two days. The patient had a syncopal episode yesterday shortly after she finished eating in the afternoon after lunch. EMS was called, but she was not interested in coming to the Emergency Room. Today, she had a similar episode shortly after eating lunch again and therefore the family convinced to bring her to the ER. The patient denies any prodromal symptoms prior to her syncopal episode.  She has had both episodes toward the end of her meals in the past two days. She denies any dizziness, denies any tinnitus. She denies any headache. She denies any chest pain, shortness of breath. Today she had an episode of nausea and vomiting shortly after her syncopal episode. She denies any other associated symptoms presently.   No abdominal pain, no fevers, no chills, no headache, no other associated symptoms.   REVIEW OF SYSTEMS: CONSTITUTIONAL: No documented fever. No weight gain or weight loss.  EYES: No blurred or double vision.  ENT: No tinnitus. No postnasal drip. No redness of the oropharynx.  RESPIRATORY: No cough, no wheeze, no hemoptysis.  CARDIOVASCULAR: No chest pain, no orthopnea, no palpitations. Positive syncope.  GASTROINTESTINAL: Positive nausea. Positive vomiting.  No diarrhea, no abdominal pain, no melena, no hematochezia.  GU: No dysuria or hematuria.  ENDOCRINE: No polyuria or nocturia. No heat or cold intolerance.  HEMATOLOGIC:  No anemia, no bruising, no bleeding.  INTEGUMENTARY: No rashes. No lesions.  MUSCULOSKELETAL: No arthritis, no swelling, and no gout.  NEUROLOGIC: No numbness, no tingling, no ataxia, no seizure-type activity.  PSYCH: No anxiety, no  insomnia, no ADD.   PAST MEDICAL HISTORY:  1.  Hypertension.  2.  Hyperlipidemia.  3.  Rheumatoid arthritis.  4.  Gastroesophageal reflux disease. 5.  Hypothyroidism. 6.  Osteoporosis.   ALLERGIES: No known drug allergies.   SOCIAL HISTORY: No smoking. No alcohol abuse. No illicit drug abuse. Lives home by herself.   FAMILY HISTORY: Mother died from complications of old age. Father died from a myocardial infarction.   CURRENT MEDICATIONS:  1.  Tylenol with hydrocodone 300/5, 1/2 to 1 tab q.6 hours, as needed.  2.  Fosamax 70 mg weekly.  3.  Aspirin 81 mg daily.  4.  Calcium and vitamin D 1 tab daily.  5.  Vitamin B12 1000 mcg monthly.  6.  Folic acid 1 mg daily.  7.  Synthroid 88 mcg daily.  8.  Lisinopril 5 mg daily.  9.  Methotrexate 2.5 mg 9 tabs weekly.  10.  Metoprolol tartrate 25 mg, 1/2 tab b.i.d.  11.  Omeprazole 20 mg daily.  12.  Pravachol 10 mg daily.   PHYSICAL EXAMINATION ON ADMISSION:  VITAL SIGNS: Temperature 97.8, pulse 82, respirations 20, blood pressure 172/77, saturations 97% on room air.  GENERAL: She is a pleasant appearing female in no apparent distress.  HEENT: Atraumatic, normocephalic. Extraocular muscles are intact. Pupils equal and reactive to light. Sclerae anicteric. No conjunctival injection. No pharyngeal erythema.  NECK: Supple. No jugulovenous distention, no bruits, no lymphadenopathy, no thyromegaly.  HEART: Regular rate and rhythm. 2/6 systolic ejection murmur at the right sternal border. No rubs and no clicks.  LUNGS: Clear to auscultation, bilaterally. No rales, no  rhonchi, no wheezes.  ABDOMEN: Soft, flat, nontender, nondistended. Has good bowel sounds. No hepatosplenomegaly appreciated.  EXTREMITIES: No evidence of any cyanosis, clubbing, or peripheral edema. Has +2 pedal and radial pulses, bilaterally.  NEUROLOGIC: The patient is alert, awake, and oriented x 3 with no focal motor or sensory deficits appreciated, bilaterally.  SKIN: Moist  and warm with no rash appreciated.  LYMPHATIC: There is no cervical or axillary lymphadenopathy.   DIAGNOSTIC DATA:  Serum glucose 100, BUN 17, creatinine 1.03, sodium 140, potassium 4, chloride 107, bicarbonate 23. The patient's LFTs are within normal limits. Troponin less than 0.02. White cell count 8.9, hemoglobin 11.3, hematocrit 35, platelet count 198. Urinalysis within normal limits.   EKG was done which showed normal sinus rhythm with left axis deviation and left ventricular hypertrophy by voltage criteria, but no other acute ST or T-wave changes.   ASSESSMENT AND PLAN: This is an 82 year old female with a history of osteoporosis, hypertension,  rheumatoid arthritis, hypothyroidism, hyperlipidemia, gastroesophageal reflux disease, who presents to the hospital with recurrent syncopal episode.  1.  Recurrent syncope. The exact etiology of syncope is unclear. Both episodes have occurred shortly after she finishes a meal.  She had some nausea and vomiting today, but not yesterday. Questionable if this is likely vasovagal in nature. She has a nonfocal neurological exam. Will observe her overnight on telemetry. Get a CT of the head without contrast.  Also get a carotid duplex and two-dimensional echocardiogram. Will cycle her cardiac markers. Will follow-up on orthostatic vital signs.  2.  Hypothyroidism. Continue with Synthroid. We will followup her TSH.  3.  Hypertension. Continue metoprolol lisinopril.  4.  Osteoporosis.  Continue calcium and vitamin D.  5.  Gastroesophageal reflux disease. Continue omeprazole. 6.  Hyperlipidemia: Continue Pravachol.  7.  Rheumatoid arthritis. The patient is on methotrexate weekly. Follows up with Dr. Lavenia Atlas.         The patient will be transferred over to Dr. Eston Esters service.   Time Spent on admission: 45 minutes.   ____________________________ Debra Pancake. Cherlynn Kaiser, Debra Singh vjs:eg D: 06/09/2012 19:10:09 ET T: 06/09/2012 20:56:27  ET JOB#: [id number]  cc: Debra Pancake. Cherlynn Kaiser, Debra Singh, <Dictator> [place] Siren Debra Singh ELECTRONICALLY SIGNED 06/12/2012 8:21

## 2014-10-01 NOTE — Consult Note (Signed)
81 yo admitted with two episodes of mid abdominal pain, N/V, and syncope.  Her US shows gallstones.  Carotid duplex unrevealing.  Currently getting cardiac work-up.  Question is raised about possibility of mesenteric ischemia as a cause.  No history of food fear or weight loss, no previous post-prandial abdominal pain.  At her age, atherosclerotic disease of the viscerals is certainly possible.  Will order a CT angiogram for further evaluation.    Electronic Signatures: Annice Needyew, Savilla Turbyfill S (MD)  (Signed on 30-Dec-13 15:47)  Authored  Last Updated: 30-Dec-13 15:47 by Annice Needyew, Immaculate Crutcher S (MD)

## 2014-10-04 NOTE — H&P (Signed)
PATIENT NAME:  Debra Singh, Debra KosRNELL C MR#:  [id number] DATE OF BIRTH:  1920-09-28  DATE OF ADMISSION:  07/10/2012  PRIMARY CARE PHYSICIAN: Barbette ReichmannVishwanath Hande, MD   CHIEF COMPLAINT: "I passed out."   HISTORY OF PRESENT ILLNESS: This is an 81 year old female who presents to the ER after passing out on Saturday. She was sitting there. Family caught her and lay her down on the floor. She was out for 2 to 3 minutes, was coherent. When she came to, she spit up a little at that time. She did not want to come to the ER. This was on Saturday and today is Monday. Of note, on last Thursday she also had a fall on the floor and could not get up, and she has not been able to walk for the past 4 days secondary to her legs being weak. The patient feels good now and does not offer any complaints. She does complain of a little lower back pain when I do ask her. In the ER, she was found to have a positive urinalysis. Hospitalist services were contacted for further evaluation. Of note, the patient was just here for syncope and had a work-up done, and her syncope was thought secondary to orthostatic hypotension.   PAST MEDICAL HISTORY: Hypothyroidism, hypertension, hyperlipidemia, rheumatoid arthritis, gastroesophageal reflux disease, osteoporosis, lung nodule.   PAST SURGICAL HISTORY: Bilateral knee replacement and intestinal surgery.   ALLERGIES: No known drug allergies.   MEDICATIONS: As per Prescription Writer include: Alendronate 70 mg once a week, aspirin 81 mg daily, calcium and vitamin D daily, vitamin B12 1000 mcg/mL injectable once a month, folic acid 1 mg daily, levothyroxine 88 mcg daily, lisinopril 5 mg daily, methotrexate 2.5 mg, 9 tablets once a week, metoprolol 25 mg 1/2 tablet twice a day, omeprazole 20 mg daily, Pravastatin 10 mg at bedtime, prednisone 5 mg daily.   SOCIAL HISTORY: She used to work as a Advertising copywriterhousekeeper. No smoking. No alcohol. No drug use. She lives with family.   FAMILY HISTORY: Father died at  3876, most likely heart-related issue. He had atrial fibrillation. Mother died at 3795 of pneumonia.   REVIEW OF SYSTEMS:  CONSTITUTIONAL: No fever, chills, or sweats. Positive for weight loss. Positive for leg weakness.  EYES: She does wear glasses.  EARS, NOSE, MOUTH, AND THROAT: Decreased hearing. Positive for runny nose. No sore throat. No difficulty swallowing.  CARDIOVASCULAR: No chest pain. No palpitations.  RESPIRATORY: No shortness of breath. Positive for cough occasionally. No production. No hemoptysis.  GASTROINTESTINAL: No nausea. No vomiting. No abdominal pain. No diarrhea. No constipation. No bright red blood per rectum. No melena.  GENITOURINARY: No burning on urination or hematuria.  MUSCULOSKELETAL: Positive for joint pain, low back pain and knee pain.  INTEGUMENT: No rashes or eruptions.  NEUROLOGIC: Positive for syncope.  PSYCHIATRIC: No anxiety or depression.  ENDOCRINE: Positive for hypothyroidism.  HEMATOLOGIC/LYMPHATIC: Positive for B12 deficiency.   PHYSICAL EXAMINATION: VITAL SIGNS: Temperature 97, pulse 73, respirations 19, blood pressure 133/71, pulse oximetry 98% on room air. I am not sure how they did these orthostatic vital signs because the patient was unable to stand for me, but she is not orthostatic on the vital signs in the Emergency Room.  HEENT: Conjunctivae and lids normal. Pupils are equal, round, and reactive to light. Extraocular muscles are intact. No nystagmus. Ears, nose, mouth, and throat: Tympanic membranes no erythema. Nasal mucosa no erythema. Throat no erythema. No exudate seen. Lips and gums no lesions.  NECK: No JVD. No  bruits. No lymphadenopathy. No thyromegaly. No thyroid nodules palpated.  LUNGS: Lungs are clear to auscultation. No use of accessory muscles to breathe. No rhonchi, rales, or wheeze heard.  CARDIOVASCULAR: S1, S2 normal. Positive 3/6 systolic ejection murmur. Carotid upstroke 2+ bilaterally, no bruits.  EXTREMITIES: Dorsalis pedis  pulses 1+ bilaterally. Trace edema of lower extremities.  ABDOMEN: Soft, nontender. No organomegaly or splenomegaly. Normoactive bowel sounds. No masses felt.  LYMPHATIC: No lymph nodes in the neck.  MUSCULOSKELETAL: Trace edema. No clubbing. No cyanosis.  SKIN: No rashes or ulcers seen.  NEUROLOGICAL: Cranial nerves II through XII are grossly intact. Deep tendon reflexes are hard to elicit bilaterally. Sensation is intact to light touch bilaterally. The patient is unable to straight leg raise either leg. When I do hold her legs up, she is able to hold it up for a few seconds, and then it drops down to the bed. Power 4 out of 5 upper extremities. The patient does have joint deformities on her hands which make her grip strength weak.  PSYCHIATRIC: The patient is oriented to person, place, and time.   LABORATORY AND RADIOLOGICAL DATA:  CT scan of the head shows opacification of the right ethmoid sinus, mucus retention cyst in the right maxillary sinus, stable atrophy and chronic microvascular ischemic change. EKG shows sinus rhythm, PVCs, PACs with left ventricular hypertrophy.   White blood cell count 9.8, H and H 11.5 and 34.9, platelet count of 197. Glucose 170, BUN 16, creatinine 0.95, sodium 139, potassium 3.3, chloride 104, CO2 27, calcium 8.9, alkaline phosphatase 170, ALT 28, AST 32, albumin 3.3. Troponin negative. Urinalysis: 2+ leukocyte esterase, 1+ blood, 1+ bacteria.   ASSESSMENT AND PLAN: 1. Weakness bilaterally with some back pain: I need to rule out a compression fracture since the patient takes chronic prednisone and has osteoporosis. I will get an x-ray of the thoracic and lumbar spine and pelvis. I will get a physical therapy evaluation. If any abnormalities on the x-ray, may end up needing an MRI.  Depending on physical therapy evaluation, may end up needing rehab.  2. Syncope: The patient was recently here, had a syncope workup. Unable to really test orthostatic vital signs at this  point since she is unable to move around very well. I will continue to monitor on telemetry off unit and will check cardiac enzymes.  Since the patient had the syncope work-up last time, I will not repeat.  3. Urinary tract infection:  I will start on Rocephin and send off a urine culture.  4. Hypertension: Blood pressure is currently stable on current medications.  5. Hypothyroidism:  Continue levothyroxine.  6. Rheumatoid arthritis: On methotrexate and prednisone.  7. Lung nodule diagnosed last time in the hospital: Will end up needing a follow-up CT scan as outpatient.  8. Hypokalemia: I will replace potassium and check a magnesium in the a.m.  9. Hyperlipidemia: Continue Pravastatin.  10. Impaired fasting glucose: I will check a hemoglobin A1c in the a.m.    TIME SPENT ON ADMISSION: 55 minutes.   ____________________________ Herschell Dimes. Renae Gloss, MD rjw:cb D: 07/10/2012 17:01:25 ET T: 07/10/2012 17:54:04 ET JOB#: [id number]  cc: Herschell Dimes. Renae Gloss, MD, <Dictator> Barbette Reichmann, MD Salley Scarlet MD ELECTRONICALLY SIGNED 07/14/2012 16:02

## 2014-10-04 NOTE — Discharge Summary (Signed)
PATIENT NAME:  Debra Singh, Debra Singh MR#:  [id number] DATE OF BIRTH:  December 23, 1920  DATE OF ADMISSION:  07/10/2012 DATE OF DISCHARGE:    DISCHARGE DIAGNOSES: 1.  Recurrent syncope.  2.  Bilateral lower extremity weakness with difficulty in ambulation.  3.  Urinary tract infection with Klebsiella pneumonia.  4.  Hypertension.  5.  Hypothyroidism.  6.  History of rheumatoid arthritis.  7.  Hyperlipidemia.   CHIEF COMPLAINT: Syncope.   HISTORY OF PRESENT ILLNESS: The patient is an 81 year old female who presented to the ER after she passed out. The patient states that she was sitting in the chair and subsequently slumped was apparently out for about 2 to 3 minutes. She was coherent after she regained consciousness. The patient also states that she had fallen to the floor and had difficulty walking for the past 4 days prior to admission because of weakness in both lower extremities.   She did not complain of any low back pain.  The patient had been admitted a few weeks prior with similar complaints of syncope and had a full work-up including CT scan, MRI and carotid ultrasound which were generally negative. The patient also had undergone a mesenteric angiogram, which was negative for mesenteric occlusion.   PAST MEDICAL HISTORY: Significant for hypothyroidism, hypertension, hyperlipidemia, rheumatoid arthritis, gastroesophageal reflux disease, osteoporosis and lung nodule.   PAST SURGICAL HISTORY:  Bilateral knee replacement and intestinal surgery.   ALLERGIES: She did not have any known drug allergies.   PHYSICAL EXAMINATION: VITAL SIGNS: Temperature 97, pulse 72, respirations 19, blood pressure 130/71, pulse ox 98% on room air.   HEENT: Normocephalic, atraumatic.  NECK: No JVD.  LUNGS: Clear to auscultation.  HEART: S1, S2. 3/6 systolic ejection murmur.  ABDOMEN: Soft, nontender.  EXTREMITIES: No edema.  NEUROLOGICAL: The patient had good sensation in both lower extremities; however, she was  unable to straighten her leg or raise her leg.  Her grip strength was also weak. CT scan of the head showed opacification of the right ethmoid sinus, mucous or retention cyst in the right maxillary sinus is stable, atrophy and chronic microvascular ischemic changes. EKG showed sinus rhythm with PVCs and PACs and left ventricular hypertrophy.   LABORATORY AND DIAGNOSTIC DATA:  WBC count 9.8, hemoglobin 11.5, hematocrit 34.9, platelets 197, glucose 170, BUN 16, creatinine 0.95, sodium 139, potassium 3.3, chloride 104, CO2 27, calcium 8.9, alkaline phosphatase 170. ALT 28, AST 32, albumin 3.3. Troponin was negative. Urinalysis showed 2+ leukocyte esterase and 12 WBC per high power field. Urine culture grew Klebsiella pneumonia that was sensitive to Rocephin and Cipro. The patient was treated with intravenous Rocephin. She also underwent an EEG that was negative for seizure focus. Her serum potassium gradually improved. Troponins were negative.   X-ray of the pelvis showed no acute abnormality of the pelvis. There is moderate degenerative space narrowing of both hips.  Lumbar spine x-ray did not show evidence of acute compression fracture of the lumbar vertebrae.  There was mild degenerative changes present. Thoracic spine x-ray did not show evidence of a compression fracture. There was degenerative changes of lower thoracic spine.   HOSPITAL COURSE:   During her stay in the hospital, the patient was seen by physical therapy.  She improved to some extent but continued to be weak and had difficulty in ambulation. She was continued on her home medications including metoprolol, lisinopril, levothyroxine, pravastatin, omeprazole in addition to folic acid, metoprolol and calcium carbonate.  The patient was stable at the time  of discharge. Discussions were held with the family and it was felt that she would benefit from rehab. She was discharged to rehab on the following medications.   DISCHARGE MEDICATIONS:  Cipro  200 mg p.o. b.i.d. for 1 more week., levothyroxine 88 mcg once a day, omeprazole 20 mg once a day, pravastatin 10 mg once a day, metoprolol tartrate 12.5 mg b.i.d., folic acid 1 mg a day, methotrexate 2.5 mg 9 tablets once a week, alendronate  70 mg once a week, lisinopril 5 mg once a day, prednisone 5 mg once a day, folic acid 1 tablet a day and aspirin 81 mg p.o. daily.   FOLLOWUP: The patient has been advised to follow-up with me, Dr. Marcello Fennel, in 1 to 2 weeks' time. She was advised to call back if there are any questions or concerns.   Total time spent in discharging patient : 35 minutes    ____________________________ Barbette Reichmann, MD vh:ct D: 07/14/2012 13:08:28 ET T: 07/14/2012 13:31:33 ET JOB#: [id number]  cc: Barbette Reichmann, MD, <Dictator> Barbette Reichmann MD ELECTRONICALLY SIGNED 08/08/2012 13:36

## 2014-10-04 NOTE — Discharge Summary (Signed)
PATIENT NAME:  Cooner, Tarry KosRNELL C MR#:  [id number] DATE OF BIRTH:  February 02, 1921  DATE OF ADMISSION:  06/09/2012 DATE OF DISCHARGE:  06/14/2012  FINAL DIAGNOSES: 1. Syncope, likely secondary to orthostasis.  2. Abdominal pain, etiology unclear.  3. Hypertension.  4. Rheumatoid arthritis. 5. Gastroesophageal reflux disease. 6. Hypothyroidism.  7. Osteoporosis.   PRINCIPLE PROCEDURES: Myoview, negative for ischemia.   HISTORY AND PHYSICAL: Please see the dictated admission history and physical.   SUMMARY OF HOSPITAL COURSE: The patient was admitted with an episode of syncope, which appears to have been postprandial. Cardiac enzymes were followed, which were negative for ischemia. Stress testing was performed, which was negative for ischemia as well. The patient did have some abdominal pain, which raised the possibility of mesenteric ischemia as source of the above. Vascular surgery saw the patient, and the patient underwent CT angiogram, which revealed no evidence of flow limiting disease. A low attenuation lesion was seen in the left kidney thought to be consistent with a cyst, 4 mm nodule in the right middle lobe also present, with recommendation for follow-up CT scans periodically, and small polypoid soft tissue density was found in the gastroesophageal lumen, which appeared to be consistent with a gastric polyp.   The patient had no further symptoms, no syncope, and felt ready for discharge to home. It was felt that the above medical problems could be monitored on an outpatient basis. Therefore, the patient was discharged to home in stable condition with physical activity up with cane as tolerated. She is to follow a 2 gram sodium diet and then follow-up was made with her primary care physician, Dr. Marcello FennelHande, in the next 1 to 2 weeks.   DISCHARGE MEDICATIONS: 1. Levothyroxine 0.088 mg p.o. daily.  2. Omeprazole 20 mg p.o. daily.  3. Pravastatin 10 mg p.o. at bedtime.  4. Lopressor 12.5 mg p.o.  twice a day.  5. Enteric-coated aspirin 81 mg p.o. daily.  6. Calcium 600 mg p.o. daily.  7. Vitamin B12 1000 mcg IM every month.  8. Folate 1 mg p.o. daily.  9. Methotrexate 22.5 mg p.o. q. week.  10. Alendronate 70 mg p.o. every week.  11. Norco 5/300 mg 1/2 to 1 tablet p.o. every 6 hours p.r.n. severe pain.   NOTE: The patient was given instructions to hold lisinopril. ____________________________ Lynnea FerrierBert J. Reuven Braver III, MD [id number] D: 06/23/2012 13:04:00 ET T: 06/23/2012 14:36:25 ET JOB#: [id number]  cc: Lynnea FerrierBert J. Loyal Rudy III, MD, <Dictator> Daniel NonesBERT Iysha Mishkin MD ELECTRONICALLY SIGNED 07/10/2012 19:57

## 2014-10-15 ENCOUNTER — Encounter
Admission: RE | Admit: 2014-10-15 | Discharge: 2014-10-15 | Disposition: A | Discharge status: Home / Self Care | Payer: Medicare Other | Source: Ambulatory Visit | Attending: Internal Medicine | Admitting: Internal Medicine

## 2014-11-13 ENCOUNTER — Encounter
Admission: RE | Admit: 2014-11-13 | Discharge: 2014-11-13 | Disposition: A | Discharge status: Home / Self Care | Payer: Medicare Other | Source: Ambulatory Visit | Attending: Internal Medicine | Admitting: Internal Medicine

## 2014-12-13 ENCOUNTER — Encounter
Admission: RE | Admit: 2014-12-13 | Discharge: 2014-12-13 | Disposition: A | Discharge status: Home / Self Care | Payer: Medicare Other | Source: Ambulatory Visit | Attending: Internal Medicine | Admitting: Internal Medicine

## 2014-12-13 DIAGNOSIS — E785 Hyperlipidemia, unspecified: Secondary | ICD-10-CM | POA: Insufficient documentation

## 2014-12-17 DIAGNOSIS — E785 Hyperlipidemia, unspecified: Secondary | ICD-10-CM | POA: Diagnosis not present

## 2014-12-17 LAB — CBC
HEMATOCRIT: 38.5 % (ref 35.0–47.0)
Hemoglobin: 12.7 g/dL (ref 12.0–16.0)
MCH: 35.2 pg — AB (ref 26.0–34.0)
MCHC: 33 g/dL (ref 32.0–36.0)
MCV: 106.9 fL — ABNORMAL HIGH (ref 80.0–100.0)
Platelets: 110 10*3/uL — ABNORMAL LOW (ref 150–440)
RBC: 3.61 MIL/uL — ABNORMAL LOW (ref 3.80–5.20)
RDW: 14.9 % — ABNORMAL HIGH (ref 11.5–14.5)
WBC: 8.7 10*3/uL (ref 3.6–11.0)

## 2014-12-17 LAB — ALT: ALT: 23 U/L (ref 14–54)

## 2014-12-17 LAB — CREATININE, SERUM
Creatinine, Ser: 0.83 mg/dL (ref 0.44–1.00)
GFR, EST NON AFRICAN AMERICAN: 60 mL/min — AB (ref >60)

## 2014-12-17 LAB — ALBUMIN: Albumin: 3 g/dL — ABNORMAL LOW (ref 3.5–5.0)

## 2014-12-17 LAB — AST: AST: 30 U/L (ref 15–41)

## 2014-12-17 LAB — SEDIMENTATION RATE: SED RATE: 10 mm/h (ref 0–30)

## 2015-01-07 DIAGNOSIS — E785 Hyperlipidemia, unspecified: Secondary | ICD-10-CM | POA: Diagnosis not present

## 2015-01-07 LAB — SEDIMENTATION RATE: Sed Rate: 13 mm/hr (ref 0–22)

## 2015-01-07 LAB — CBC WITH DIFFERENTIAL/PLATELET
Basophils Absolute: 0 10*3/uL (ref 0–0.1)
Basophils Relative: 0 %
EOS ABS: 0.2 10*3/uL (ref 0–0.7)
Eosinophils Relative: 2 %
HEMATOCRIT: 38.6 % (ref 35.0–47.0)
Hemoglobin: 12.9 g/dL (ref 12.0–16.0)
LYMPHS PCT: 29 %
Lymphs Abs: 2.4 10*3/uL (ref 1.0–3.6)
MCH: 34.8 pg — ABNORMAL HIGH (ref 26.0–34.0)
MCHC: 33.3 g/dL (ref 32.0–36.0)
MCV: 104.5 fL — AB (ref 80.0–100.0)
Monocytes Absolute: 0.7 10*3/uL (ref 0.2–0.9)
Monocytes Relative: 8 %
NEUTROS PCT: 61 %
Neutro Abs: 5.1 10*3/uL (ref 1.4–6.5)
PLATELETS: 121 10*3/uL — AB (ref 150–440)
RBC: 3.7 MIL/uL — ABNORMAL LOW (ref 3.80–5.20)
RDW: 14.7 % — AB (ref 11.5–14.5)
WBC: 8.4 10*3/uL (ref 3.6–11.0)

## 2015-01-07 LAB — AST: AST: 29 U/L (ref 15–41)

## 2015-01-07 LAB — CREATININE, SERUM: Creatinine, Ser: 0.76 mg/dL (ref 0.44–1.00)

## 2015-01-07 LAB — ALT: ALT: 20 U/L (ref 14–54)

## 2015-01-07 LAB — ALBUMIN: Albumin: 3.2 g/dL — ABNORMAL LOW (ref 3.5–5.0)

## 2015-01-13 ENCOUNTER — Encounter
Admission: RE | Admit: 2015-01-13 | Discharge: 2015-01-13 | Disposition: A | Discharge status: Home / Self Care | Payer: Medicare Other | Source: Ambulatory Visit | Attending: Internal Medicine | Admitting: Internal Medicine

## 2015-01-13 DIAGNOSIS — I10 Essential (primary) hypertension: Secondary | ICD-10-CM | POA: Insufficient documentation

## 2015-01-13 DIAGNOSIS — K219 Gastro-esophageal reflux disease without esophagitis: Secondary | ICD-10-CM | POA: Insufficient documentation

## 2015-01-13 DIAGNOSIS — E039 Hypothyroidism, unspecified: Secondary | ICD-10-CM | POA: Insufficient documentation

## 2015-01-23 DIAGNOSIS — E039 Hypothyroidism, unspecified: Secondary | ICD-10-CM | POA: Diagnosis not present

## 2015-01-23 DIAGNOSIS — K219 Gastro-esophageal reflux disease without esophagitis: Secondary | ICD-10-CM | POA: Diagnosis not present

## 2015-01-23 DIAGNOSIS — I10 Essential (primary) hypertension: Secondary | ICD-10-CM | POA: Diagnosis not present

## 2015-01-23 LAB — BASIC METABOLIC PANEL
Anion gap: 7 (ref 5–15)
BUN: 27 mg/dL — ABNORMAL HIGH (ref 6–20)
CO2: 29 mmol/L (ref 22–32)
Calcium: 9 mg/dL (ref 8.9–10.3)
Chloride: 107 mmol/L (ref 101–111)
Creatinine, Ser: 0.71 mg/dL (ref 0.44–1.00)
Glucose, Bld: 96 mg/dL (ref 65–99)
POTASSIUM: 3.5 mmol/L (ref 3.5–5.1)
SODIUM: 143 mmol/L (ref 135–145)

## 2015-01-23 LAB — VITAMIN B12: Vitamin B-12: 691 pg/mL (ref 180–914)

## 2015-01-23 LAB — MAGNESIUM: Magnesium: 2 mg/dL (ref 1.7–2.4)

## 2015-01-23 LAB — HEMOGLOBIN A1C: Hgb A1c MFr Bld: 5.5 % (ref 4.0–6.0)

## 2015-01-23 LAB — TSH: TSH: 4.219 u[IU]/mL (ref 0.350–4.500)

## 2015-02-13 ENCOUNTER — Encounter
Admission: RE | Admit: 2015-02-13 | Discharge: 2015-02-13 | Disposition: A | Discharge status: Home / Self Care | Payer: Medicare Other | Source: Ambulatory Visit | Attending: Internal Medicine | Admitting: Internal Medicine

## 2015-04-15 DEATH — deceased
